# Patient Record
Sex: Male | Born: 1985 | Race: Black or African American | Hispanic: No | Marital: Single | Smoking: Never smoker
Health system: Southern US, Community
[De-identification: ages and names within clinical notes are randomized; demographics above are authoritative.]

---

## 2008-01-31 ENCOUNTER — Emergency Department (HOSPITAL_COMMUNITY): Admission: AC | Admit: 2008-01-31 | Discharge: 2008-01-31 | Payer: Self-pay | Admitting: Orthopedic Surgery

## 2008-01-31 ENCOUNTER — Inpatient Hospital Stay (HOSPITAL_COMMUNITY): Admission: EM | Admit: 2008-01-31 | Discharge: 2008-02-05 | Payer: Self-pay | Admitting: Psychiatry

## 2008-02-05 ENCOUNTER — Ambulatory Visit: Payer: Self-pay | Admitting: Psychiatry

## 2008-04-05 ENCOUNTER — Emergency Department (HOSPITAL_COMMUNITY): Admission: EM | Admit: 2008-04-05 | Discharge: 2008-04-06 | Payer: Self-pay

## 2008-04-06 ENCOUNTER — Inpatient Hospital Stay (HOSPITAL_COMMUNITY): Admission: AD | Admit: 2008-04-06 | Discharge: 2008-04-15 | Payer: Self-pay | Admitting: Psychiatry

## 2008-04-06 ENCOUNTER — Ambulatory Visit: Payer: Self-pay | Admitting: Psychiatry

## 2010-07-25 ENCOUNTER — Emergency Department (HOSPITAL_COMMUNITY): Admission: EM | Admit: 2010-07-25 | Discharge: 2010-07-25 | Payer: Self-pay | Admitting: Emergency Medicine

## 2010-08-10 ENCOUNTER — Emergency Department (HOSPITAL_COMMUNITY): Admission: EM | Admit: 2010-08-10 | Discharge: 2010-08-10 | Payer: Self-pay | Admitting: Emergency Medicine

## 2011-03-12 NOTE — H&P (Signed)
Bobby [name], JOLLEY NO.:  [id number]   MEDICAL RECORD NO.:  [id number]          PATIENT TYPE:  IPS   LOCATION:  0406                          FACILITY:  [facility]   PHYSICIAN:  Geoffery Lyons, M.D.      DATE OF BIRTH:  1985-11-12   DATE OF ADMISSION:  04/06/2008  DATE OF DISCHARGE:                       PSYCHIATRIC ADMISSION ASSESSMENT   This is an involuntary admission to the services of Dr. Geoffery Lyons.   IDENTIFYING INFORMATION:  This is a 25 year old single [place] American  male.  Apparently he was brought via EMS after 911 was called.  The  patient had been found wandering aimlessly in an apartment complex on  [facility] [address] [facility].  He could tell EMS I need help but has not verbally  responded since then.  He was alert.  He could follow verbal commands.  He nods to voice understanding but is otherwise verbally unresponsive.  The ED physician requested that the writer petition the patient so that  he can complete his medical clearance and have the patient placed.  Somehow he was identified and it appears that he was last with [place] here  at the [facility] January 31, 2008 to February 05, 2008.  He had  a similar presentation.  At that time it was stated he had become more  confused, disorganized, with some paranoia, hallucinatory experiences.  Drug abuse was suspected, however, this is not confirmed.  His UDS was  negative.  He was stabilized and sent out on Zyprexa/Zydis 50 mg at h.s.  It is unclear if he has been compliant or not.   SOCIAL HISTORY:  He will not tell me, etc.   He will not tell me family history, alcohol and drug history, medical  history and primary care Zana Biancardi, medical problems, medications, drug  allergies, etc.   He was medically cleared in the ED at [facility].  As already  stated, his UDS was negative.  His other labs were unremarkable with no  abnormal findings.   His vital signs on admission to our unit showed that he is 73  inches  tall, weighs 180.  Temperature is 98.  Blood pressure is 112/79 to  110/74.  Pulse is 91 and respirations are 13.  He has various scars that  have been documented on the anatomic drawing, however, we are not clear  what these are from.   MENTAL STATUS EXAM:  He is alert and oriented.  He is casually groomed  and dressed.  He appears to be somewhat undernourished.  He is sort of  thin.  He can speak; he just chooses not to.  He answered a couple of  little questions and then he quit being responsive.  His mood is  depressed.  His affect is congruent.  He is kind of blank.  He shakes  his head no when you ask him if he is seeing things or hearing things  but he appears to be responding at least to some degree to internal  stimuli.  The remainder of his exam was incomplete due to  nonparticipation.  DIAGNOSES:  AXIS I:  Psychotic disorder, not otherwise specified,  presumed to be due to noncompliance.  AXIS II:  Deferred.  AXIS III:  None known.  AXIS IV:  Appears to be problems with primary support group.  AXIS V:  20.   PLAN:  Admit for safety and stabilization.  We will reestablish  compliance with medications and we will have to see about having him  entered into an assisted living facility or group home for purposes of  him maintaining compliance with medication once he is stabilized.      Mickie Leonarda Salon, P.A.-C.      Geoffery Lyons, M.D.  Electronically Signed    MD/MEDQ  D:  04/07/2008  T:  04/07/2008  Job:  [id number]

## 2011-03-15 NOTE — Discharge Summary (Signed)
Bobby [name], Bobby [name] NO.:  [id number]   MEDICAL RECORD NO.:  [id number]          PATIENT TYPE:  IPS   LOCATION:  0406                          FACILITY:  [facility]   PHYSICIAN:  Anselm Jungling, MD  DATE OF BIRTH:  Jul 31, 1986   DATE OF ADMISSION:  04/06/2008  DATE OF DISCHARGE:  04/15/2008                               DISCHARGE SUMMARY   IDENTIFYING DATA AND REASON FOR ADMISSION:  The patient is a 25 year old  male who was admitted after 9-1-1 was called.  He had been found  wandering aimlessly in an apartment complex on [facility].  He  indicated to emergency medical personnel that he needed help, but he  became mute following this.  He was brought to the emergency department,  medically cleared, and then brought for inpatient psychiatric admission.  Please refer to the admission note for further details pertaining to the  symptoms, circumstances and history that led to his hospitalization.  He  was given an Axis I diagnosis of psychosis NOS.   The patient had a history of previous admissions to our facility in  April 2009 with a similar presentation.  He had been stabilized  successfully on Zyprexa at bedtime.  Apparently, he had been  noncompliant with Zyprexa in the interval between these two admissions.   MEDICAL AND LABORATORY:  The patient was medically and physically  assessed and cleared in the emergency department, and then reassessed by  the psychiatric nurse practitioner.  He was in good health without any  active or chronic medical problems.   HOSPITAL COURSE:  The patient was admitted to the adult inpatient  psychiatric service.  He presented as a slender, but normally-developed  African American male who was initially quite guarded, withdrawn, and  nonverbal.  He was restarted on Zyprexa 15 mg q.h.s.  Over the course of  several days, he gradually became more active in the milieu, less  guarded, and more verbal.   By the seventh hospital  day, he had improved significantly and appeared  to be gaining insight into his illness.  He was compliant with  medication throughout his stay.  Family were contacted, and they were  involved in discharge planning.   AFTERCARE:  The patient was to follow up at [facility] with an appointment with their psychiatrist on April 21, 2008.   DISCHARGE MEDICATION:  Zyprexa 15 mg q.h.s.   DISCHARGE DIAGNOSES:  AXIS I:  Schizophrenia, chronic paranoid type,  acute exacerbation.  AXIS II:  Deferred.  AXIS III:  No acute or chronic illnesses.  AXIS IV:  Stressors severe.  AXIS V:  Global assessment of functioning on discharge 55.      Anselm Jungling, MD  Electronically Signed     SPB/MEDQ  D:  04/18/2008  T:  04/18/2008  Job:  [id number]

## 2011-03-15 NOTE — Discharge Summary (Signed)
[name], [name]   MEDICAL RECORD NO.:  [id number]          PATIENT TYPE:  IPS   LOCATION:  0406                          FACILITY:  [facility]   PHYSICIAN:  Geoffery Lyons, M.D.      DATE OF BIRTH:  1986-05-12   DATE OF ADMISSION:  01/31/2008  DATE OF DISCHARGE:  02/05/2008                               DISCHARGE SUMMARY   CHIEF COMPLAINT AND HISTORY OF PRESENT ILLNESS:  This was the first  admission to [facility] for this 25 year old male who  was picked up by the police while walking along an avenue, seemed to be  confused, unable to give basic information. He was taken to the ED and  said that in the last few days, he has been more confused, disorganized,  with some paranoia, some hallucinatory experience. Suggestions were made  that he was abusing marijuana, yet UDS was negative.  He minimized, he  denied that he was using any substances. Report said that he was not  able to provide his name, birthday, or any other identifying  information.  He appeared confused, was not making sense. The patient  called a friend who then provided the emergency room with the name and  mother's contact. After three hours, he was making more sense, endorsed  he needed to get his mind together.  Endorsed, he had been hearing  whispers.   PAST PSYCHIATRIC HISTORY:  He denied previous treatment, although he is  not a reliable historian.   ALCOHOL AND DRUG HISTORY:  There were some claims about use of marijuana  and cocaine, but the UDS was negative.   MEDICAL HISTORY:  Noncontributory.   MENTAL STATUS EXAM:  Reveals alert, cooperative male, initially  spontaneous, gets suspicious, disorganized, ruminates. Mood anxiety.  Affect anxiety.  There seemed to be some underlying delusional material.  He endorsed no suicidal or homicidal ideas. No hallucinations.  Cognition was affected by the acute process.   ADMISSION DIAGNOSIS:  AXIS I:       Psychotic  disorder, not otherwise  specified.  Rule out marijuana and cocaine abuse.  AXIS II:    No diagnosis.  AXIS III:   No diagnosis.  AXIS IV:    Moderate.  AXIS V:     Upon admission 35, highest GAF in the last year 60.   COURSE IN THE HOSPITAL:  He was admitted.  He was started in individual  and group psychotherapy.  Initially, we started working with Ativan and  then we worked with Zyprexa. April 7, he was sedated, not able to  produce most information, no contact with the relative, not as  spontaneous, somewhat guarded, suspicious, some underlying psychotic  process. Caseworker spoke with the mother.  He apparently was in prison  on felony charges until a year ago, left from there, apparently no  contact with the mother for six months. He was in [place], moved to  father's home in [place]. He came to the mother's six weeks  prior to this admission.  No previous hospitalization.  No previous  mental health treatment.  There is some family history for psychiatric  disorder.  No significant work since incarceration, hanging out with  friends.  Mother suspected alcohol and drugs. Mother could not identify  any particular events leading to the hospitalization.  April 8, he was  very upset, looking very distressed, he was answering I don't know to a  lot of the questions, avoids interacting, keeps walking, uncomfortable  talking. April 9, the mother had called and left a message saying that  the patient's cousin was getting married on this Saturday and they  wanted him to be there. He continued to be reserved, guarded, but better  able to interact one-on-one. Did not know about the wedding, he wanted  to be there. He did endorse that he was dealing with past things but  said he was ready to move on.  He felt the medications were helping him.  He was willing to pursue medication upon discharge. There was a family  session with the mother. He said he was wanting to work, enrolling in   school, and doing positive things with his life. Before, he was having  racing thoughts.  He was sleeping better in the hospital. Mother said he  seemed like himself.  On April 10, he was better, he was interacting  appropriately with other patients and staff, somewhat anxious when one-  on-one, as he was concerned that he was going to stay.  When he was  reassured that he was going to be discharged, he relaxed and he was more  spontaneous.  He was in full contact with reality.  There were no active  suicidal ideas.  We went ahead and discharged to outpatient follow-up.   DISCHARGE DIAGNOSIS:  AXIS I:       Psychotic disorder, not otherwise  specified.  Rule out substance induced psychoses. Rule out substance  abuse.            Rule out mood disorder, not otherwise specified.  AXIS II:    No diagnosis.  AXIS III:   No diagnosis.  AXIS IV:    Moderate.  AXIS V:     Upon discharge 55-60.   Discharged to [facility] where they need to continue to evaluate  and refine the diagnoses.  Discharged on Zyprexa 50 mg at bedtime.      Geoffery Lyons, M.D.  Electronically Signed     IL/MEDQ  D:  03/09/2008  T:  03/09/2008  Job:  [id number]

## 2011-07-13 ENCOUNTER — Emergency Department (HOSPITAL_COMMUNITY): Payer: No Typology Code available for payment source

## 2011-07-13 ENCOUNTER — Emergency Department (HOSPITAL_COMMUNITY)
Admission: EM | Admit: 2011-07-13 | Discharge: 2011-07-13 | Disposition: A | Discharge status: Home / Self Care | Payer: No Typology Code available for payment source | Attending: Emergency Medicine | Admitting: Emergency Medicine

## 2011-07-13 DIAGNOSIS — R51 Headache: Secondary | ICD-10-CM | POA: Insufficient documentation

## 2011-07-13 DIAGNOSIS — H538 Other visual disturbances: Secondary | ICD-10-CM | POA: Insufficient documentation

## 2011-07-13 DIAGNOSIS — F17200 Nicotine dependence, unspecified, uncomplicated: Secondary | ICD-10-CM | POA: Insufficient documentation

## 2011-07-13 DIAGNOSIS — S060X0A Concussion without loss of consciousness, initial encounter: Secondary | ICD-10-CM | POA: Insufficient documentation

## 2011-07-13 DIAGNOSIS — M542 Cervicalgia: Secondary | ICD-10-CM | POA: Insufficient documentation

## 2011-07-13 DIAGNOSIS — R42 Dizziness and giddiness: Secondary | ICD-10-CM | POA: Insufficient documentation

## 2011-07-13 DIAGNOSIS — S139XXA Sprain of joints and ligaments of unspecified parts of neck, initial encounter: Secondary | ICD-10-CM | POA: Insufficient documentation

## 2011-07-23 LAB — DIFFERENTIAL
Eosinophils Relative: 0
Lymphocytes Relative: 15
Lymphs Abs: 1.2
Monocytes Absolute: 0.5
Neutro Abs: 6.2

## 2011-07-23 LAB — COMPREHENSIVE METABOLIC PANEL
AST: 21
Albumin: 4.7
BUN: 12
Calcium: 9.8
Creatinine, Ser: 0.94
Total Protein: 7.4

## 2011-07-23 LAB — RAPID URINE DRUG SCREEN, HOSP PERFORMED
Barbiturates: NOT DETECTED
Cocaine: NOT DETECTED
Opiates: NOT DETECTED
Tetrahydrocannabinol: NOT DETECTED

## 2011-07-23 LAB — URINALYSIS, ROUTINE W REFLEX MICROSCOPIC
Bilirubin Urine: NEGATIVE
Hgb urine dipstick: NEGATIVE
Nitrite: NEGATIVE
Specific Gravity, Urine: 1.01
Urobilinogen, UA: 0.2
pH: 6.5

## 2011-07-23 LAB — CBC
HCT: 41.2
MCHC: 34.8
MCV: 88.7
Platelets: 230
RDW: 12.4
WBC: 7.9

## 2011-07-25 LAB — POCT I-STAT, CHEM 8
BUN: 16
Calcium, Ion: 1.07 — ABNORMAL LOW
HCT: 45
Hemoglobin: 15.3
Sodium: 142
TCO2: 22

## 2011-07-25 LAB — RAPID URINE DRUG SCREEN, HOSP PERFORMED
Amphetamines: NOT DETECTED
Cocaine: NOT DETECTED
Opiates: NOT DETECTED
Tetrahydrocannabinol: NOT DETECTED
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
## 2016-09-26 ENCOUNTER — Emergency Department (HOSPITAL_COMMUNITY)
Admission: EM | Admit: 2016-09-26 | Discharge: 2016-09-27 | Disposition: A | Discharge status: Home / Self Care | Payer: Self-pay | Attending: Emergency Medicine | Admitting: Emergency Medicine

## 2016-09-26 ENCOUNTER — Encounter (HOSPITAL_COMMUNITY): Payer: Self-pay | Admitting: Emergency Medicine

## 2016-09-26 DIAGNOSIS — E860 Dehydration: Secondary | ICD-10-CM | POA: Insufficient documentation

## 2016-09-26 DIAGNOSIS — R55 Syncope and collapse: Secondary | ICD-10-CM

## 2016-09-26 LAB — CBC
HEMATOCRIT: 37.5 % — AB (ref 39.0–52.0)
Hemoglobin: 12.8 g/dL — ABNORMAL LOW (ref 13.0–17.0)
MCH: 30.5 pg (ref 26.0–34.0)
MCHC: 34.1 g/dL (ref 30.0–36.0)
MCV: 89.5 fL (ref 78.0–100.0)
PLATELETS: 177 10*3/uL (ref 150–400)
RBC: 4.19 MIL/uL — ABNORMAL LOW (ref 4.22–5.81)
RDW: 12.5 % (ref 11.5–15.5)
WBC: 4.4 10*3/uL (ref 4.0–10.5)

## 2016-09-26 LAB — URINE MICROSCOPIC-ADD ON

## 2016-09-26 LAB — I-STAT TROPONIN, ED: TROPONIN I, POC: 0 ng/mL (ref 0.00–0.08)

## 2016-09-26 LAB — BASIC METABOLIC PANEL
Anion gap: 7 (ref 5–15)
BUN: 14 mg/dL (ref 6–20)
CHLORIDE: 109 mmol/L (ref 101–111)
CO2: 25 mmol/L (ref 22–32)
CREATININE: 1.08 mg/dL (ref 0.61–1.24)
Calcium: 8.2 mg/dL — ABNORMAL LOW (ref 8.9–10.3)
GFR calc Af Amer: >60 mL/min (ref >60)
GFR calc non Af Amer: >60 mL/min (ref >60)
GLUCOSE: 145 mg/dL — AB (ref 65–99)
Potassium: 4.2 mmol/L (ref 3.5–5.1)
SODIUM: 141 mmol/L (ref 135–145)

## 2016-09-26 LAB — URINALYSIS, ROUTINE W REFLEX MICROSCOPIC
BILIRUBIN URINE: NEGATIVE
GLUCOSE, UA: NEGATIVE mg/dL
HGB URINE DIPSTICK: NEGATIVE
KETONES UR: NEGATIVE mg/dL
Nitrite: NEGATIVE
PH: 5.5 (ref 5.0–8.0)
PROTEIN: NEGATIVE mg/dL
Specific Gravity, Urine: 1.03 (ref 1.005–1.030)

## 2016-09-26 LAB — CBG MONITORING, ED: Glucose-Capillary: 106 mg/dL — ABNORMAL HIGH (ref 65–99)

## 2016-09-26 MED ORDER — SODIUM CHLORIDE 0.9 % IV BOLUS (SEPSIS)
30.0000 mL/kg | Freq: Once | INTRAVENOUS | Status: AC
  Administered 2016-09-26: 2040 mL via INTRAVENOUS

## 2016-09-26 MED ORDER — SODIUM CHLORIDE 0.9 % IV BOLUS (SEPSIS): 1000.0000 mL | Freq: Once | INTRAVENOUS | Status: DC

## 2016-09-26 NOTE — ED Notes (Signed)
Bed: WA08 Expected date:  Expected time:  Means of arrival:  Comments: EMS  Near syncope, orthostatic

## 2016-09-26 NOTE — ED Notes (Signed)
Pt aware of the need for urine. Tolerating fluids well.

## 2016-09-26 NOTE — ED Triage Notes (Signed)
Pt comes from work via EMS with complaints of dizziness and light headedness.  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  He gave plasma a few days ago and has not eaten or drank anything.  Initial BP 78/44 CBG 108 HR 83 O2 100% RA.  Last BP 106/70, HR 67.  900 cc of fluid given in route. Denies N&V.

## 2016-09-26 NOTE — ED Notes (Addendum)
EKG given to EDP,Pfeiffer,MD., for review. 

## 2016-09-26 NOTE — Progress Notes (Signed)
EDCM spoke to patient at bedside. Patient confirms he does not have a pcp or insurance living in [place].  EDCM provided patient with contact information to [facility], informed patient of services there.  EDCM also provided patient with list of pcps who accept self pay patients, list of discount pharmacies and websites needymeds.org and GoodRX.com for medication assistance, phone number to inquire about the orange card, phone number to inquire about Medicaid, phone number to inquire about the Affordable Care Act, financial resources in the community such as local churches, salvation army, urban ministries, and dental assistance for uninsured patients.  Patient thankful for resources.  No further EDCM needs at this time. 

## 2016-09-26 NOTE — ED Provider Notes (Signed)
[facility]-EMERGENCY DEPT Provider Note   CSN: [id number] Arrival date & time: 09/26/16  2021 By signing my name below, I, Levon HedgerElizabeth Hall, attest that this documentation has been prepared under the direction and in the presence of non-physician practitioner, Harolyn RutherfordShawn Jakera Beaupre, PA-C. Electronically Signed: Levon HedgerElizabeth Hall, Scribe. 09/26/2016. 9:23 PM.   History   Chief Complaint Chief Complaint  Patient presents with  . Near Syncope  . Orthostatic Hypotension   HPI Bobby [name] is a 30 y.o. male, with no hx of heart or kidney problems, who presents to the Emergency Department complaining of lightheadedness which began tonight PTA. 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 Endorses poor oral intake and drinking very little fluids today. Lightheadedness is exacerbated by standing up. No alleviating factors noted. He notes associated headache which he states began before he arrived at work. Patient has had headaches of this type before. Headache is bilateral, generalized, throbbing, mild in intensity, nonradiating. Pt reports donating plasma a few days ago. He denies any alcohol or illicit drug use. Pt further denies any CP, SOB, fever, N/V/D, abdominal pain, neck pain or stiffness, LOC, recent weight loss, hemorrhage, or any other complaints.      The history is provided by the patient. No language interpreter was used.   History reviewed. No pertinent past medical history.  There are no active problems to display for this patient.  History reviewed. No pertinent surgical history.   Home Medications    Prior to Admission medications   Not on File   Family History No family history on file.  Social History Social History  Substance Use Topics  . Smoking status: Never Smoker  . Smokeless tobacco: Never Used  . Alcohol use No    Allergies   Patient has no known allergies.   Review of Systems Review of Systems  Respiratory: Negative for  shortness of breath.   Cardiovascular: Negative for chest pain.  Gastrointestinal: Negative for diarrhea, nausea and vomiting.  Musculoskeletal: Negative for back pain and neck pain.  Neurological: Positive for light-headedness and headaches. Negative for syncope.  All other systems reviewed and are negative.  Physical Exam Updated Vital Signs BP 99/75 (BP Location: Left Arm)   Pulse 92   Temp 97.8 F (36.6 C) (Oral)   Resp 18   Ht 6\' 1"  (1.854 m)   Wt 150 lb (68 kg)   SpO2 100%   BMI 19.79 kg/m   Physical Exam  Constitutional: He is oriented to person, place, and time. He appears well-developed and well-nourished. No distress.  HENT:  Head: Normocephalic and atraumatic.  Eyes: Conjunctivae and EOM are normal. Pupils are equal, round, and reactive to light.  Neck: Normal range of motion. Neck supple.  Cardiovascular: Normal rate, regular rhythm, normal heart sounds and intact distal pulses.   Pulmonary/Chest: Effort normal and breath sounds normal. No respiratory distress.  Abdominal: Soft. There is no tenderness. There is no guarding.  Musculoskeletal: He exhibits no edema.  Normal motor function intact in all extremities and spine. No midline spinal tenderness.   Lymphadenopathy:    He has no cervical adenopathy.  Neurological: He is alert and oriented to person, place, and time.  No sensory deficits. Strength 5/5 in all extremities. No gait disturbance. Coordination intact. Cranial nerves III-XII grossly intact. No facial droop.   Skin: Skin is warm and dry. Capillary refill takes less than 2 seconds. He is not diaphoretic.  Psychiatric: He has a normal  mood and affect. His behavior is normal.  Nursing note and vitals reviewed.   ED Treatments / Results  DIAGNOSTIC STUDIES:  Oxygen Saturation is 100% on RA, normal by my interpretation.    COORDINATION OF CARE:  9:21 PM Discussed treatment plan with pt at bedside and pt agreed to plan.  Labs (all labs ordered are  listed, but only abnormal results are displayed) Labs Reviewed  BASIC METABOLIC PANEL - Abnormal; Notable for the following:       Result Value   Glucose, Bld 145 (*)    Calcium 8.2 (*)    All other components within normal limits  CBC - Abnormal; Notable for the following:    RBC 4.19 (*)    Hemoglobin 12.8 (*)    HCT 37.5 (*)    All other components within normal limits  URINALYSIS, ROUTINE W REFLEX MICROSCOPIC (NOT AT [facility]) - Abnormal; Notable for the following:    APPearance CLOUDY (*)    Leukocytes, UA SMALL (*)    All other components within normal limits  URINE MICROSCOPIC-ADD ON - Abnormal; Notable for the following:    Squamous Epithelial / LPF 0-5 (*)    Bacteria, UA RARE (*)    Crystals URIC ACID CRYSTALS (*)    All other components within normal limits  CBG MONITORING, ED - Abnormal; Notable for the following:    Glucose-Capillary 106 (*)    All other components within normal limits  I-STAT TROPOININ, ED    EKG  EKG Interpretation None       Radiology No results found.  Procedures Procedures (including critical care time)  Medications Ordered in ED Medications  sodium chloride 0.9 % bolus 2,040 mL (0 mL/kg  68 kg Intravenous Stopped 09/26/16 2324)     Initial Impression / Assessment and Plan / ED Course  I have reviewed the triage vital signs and the nursing notes.  Pertinent labs & imaging results that were available during my care of the patient were reviewed by me and considered in my medical decision making (see chart for details).  Clinical Course     Patient presents following an episode of near syncope. Voices continued resolution of symptoms throughout ED course. Following IV fluids, patient was able to ambulate without difficulty or assistance. Able to pass an oral fluid challenge. Suspect patient's poor oral intake combined with his recent plasma donation contributed to his incident. His current anemia is likely also due to his recent  plasma donation. Strict return precautions given. Patient voiced understanding of all instructions and is comfortable with discharge.      Vitals:   09/26/16 2315 09/26/16 2330 09/26/16 2345 09/27/16 0000  BP:  95/66  93/69  Pulse: 79 77 74 77  Resp: 18 20 17 16   Temp:      TempSrc:      SpO2: 100% 100% 100% 99%  Weight:      Height:         Final Clinical Impressions(s) / ED Diagnoses   Final diagnoses:  Near syncope  Dehydration    New Prescriptions There are no discharge medications for this patient.  I personally performed the services described in this documentation, which was scribed in my presence. The recorded information has been reviewed and is accurate.   Anselm PancoastShawn C Shaianne Nucci, PA-C 09/27/16 [id number]    Maia PlanJoshua G Long, MD 09/27/16 1120

## 2016-09-26 NOTE — ED Notes (Addendum)
Pt. Ambulated down the hall and back to his room without assistance. Pt. Gait steady on his feet and states he feels better.

## 2016-09-26 NOTE — ED Notes (Signed)
Patient given sandwich and crackers with water.

## 2016-09-26 NOTE — Discharge Instructions (Signed)
You have been seen today for near syncope. Your lab tests showed no significant abnormalities other than some lower than normal hemoglobin. This is likely due to the recent plasma donation. Patient to eat regular meals and drink at least 6-8 glasses of water throughout the day. Water intake should be increased in the days before and after plasma donation. Follow up with PCP as needed. Return to ED should symptoms worsen.

## 2016-09-27 NOTE — ED Notes (Signed)
Pt ambulatory and independent at discharge.  Verbalized understanding of discharge instructions 

## 2022-09-27 DIAGNOSIS — Z419 Encounter for procedure for purposes other than remedying health state, unspecified: Secondary | ICD-10-CM | POA: Diagnosis not present

## 2022-10-28 DIAGNOSIS — Z419 Encounter for procedure for purposes other than remedying health state, unspecified: Secondary | ICD-10-CM | POA: Diagnosis not present
